# Patient Record
Sex: Male | Born: 1935 | Race: White | Hispanic: No | Marital: Married | Smoking: Never smoker
Health system: Southern US, Community
[De-identification: ages and names within clinical notes are randomized; demographics above are authoritative.]

## PROBLEM LIST (undated history)

## (undated) DIAGNOSIS — I10 Essential (primary) hypertension: Secondary | ICD-10-CM

## (undated) DIAGNOSIS — F4320 Adjustment disorder, unspecified: Secondary | ICD-10-CM

## (undated) DIAGNOSIS — E669 Obesity, unspecified: Secondary | ICD-10-CM

## (undated) DIAGNOSIS — F419 Anxiety disorder, unspecified: Secondary | ICD-10-CM

## (undated) DIAGNOSIS — E785 Hyperlipidemia, unspecified: Secondary | ICD-10-CM

## (undated) DIAGNOSIS — I4460 Unspecified fascicular block: Secondary | ICD-10-CM

## (undated) DIAGNOSIS — F439 Reaction to severe stress, unspecified: Secondary | ICD-10-CM

## (undated) DIAGNOSIS — I509 Heart failure, unspecified: Secondary | ICD-10-CM

## (undated) DIAGNOSIS — E119 Type 2 diabetes mellitus without complications: Secondary | ICD-10-CM

## (undated) DIAGNOSIS — I209 Angina pectoris, unspecified: Secondary | ICD-10-CM

## (undated) DIAGNOSIS — I4891 Unspecified atrial fibrillation: Secondary | ICD-10-CM

## (undated) DIAGNOSIS — I2510 Atherosclerotic heart disease of native coronary artery without angina pectoris: Secondary | ICD-10-CM

## (undated) DIAGNOSIS — R0602 Shortness of breath: Secondary | ICD-10-CM

## (undated) DIAGNOSIS — L408 Other psoriasis: Secondary | ICD-10-CM

## (undated) HISTORY — DX: Reaction to severe stress, unspecified: F43.9

## (undated) HISTORY — DX: Hyperlipidemia, unspecified: E78.5

## (undated) HISTORY — DX: Angina pectoris, unspecified: I20.9

## (undated) HISTORY — DX: Other psoriasis: L40.8

## (undated) HISTORY — DX: Atherosclerotic heart disease of native coronary artery without angina pectoris: I25.10

## (undated) HISTORY — DX: Unspecified atrial fibrillation: I48.91

## (undated) HISTORY — DX: Heart failure, unspecified: I50.9

## (undated) HISTORY — DX: Type 2 diabetes mellitus without complications: E11.9

## (undated) HISTORY — DX: Essential (primary) hypertension: I10

## (undated) HISTORY — DX: Unspecified fascicular block: I44.60

## (undated) HISTORY — PX: OTHER SURGICAL HISTORY: SHX169

## (undated) HISTORY — DX: Shortness of breath: R06.02

## (undated) HISTORY — DX: Adjustment disorder, unspecified: F43.20

## (undated) HISTORY — DX: Anxiety disorder, unspecified: F41.9

## (undated) HISTORY — DX: Obesity, unspecified: E66.9

---

## 1988-02-09 HISTORY — PX: OTHER SURGICAL HISTORY: SHX169

## 2002-01-09 ENCOUNTER — Inpatient Hospital Stay (HOSPITAL_COMMUNITY): Admission: AD | Admit: 2002-01-09 | Discharge: 2002-01-17 | Payer: Self-pay | Admitting: Cardiology

## 2002-01-09 ENCOUNTER — Encounter: Payer: Self-pay | Admitting: Cardiology

## 2002-01-11 ENCOUNTER — Encounter: Payer: Self-pay | Admitting: Surgery

## 2002-01-12 ENCOUNTER — Encounter: Payer: Self-pay | Admitting: Surgery

## 2002-01-13 ENCOUNTER — Encounter: Payer: Self-pay | Admitting: Surgery

## 2002-02-05 ENCOUNTER — Encounter: Payer: Self-pay | Admitting: Cardiovascular Disease

## 2002-02-05 ENCOUNTER — Inpatient Hospital Stay (HOSPITAL_COMMUNITY): Admission: EM | Admit: 2002-02-05 | Discharge: 2002-02-12 | Payer: Self-pay | Admitting: Emergency Medicine

## 2002-02-06 ENCOUNTER — Encounter: Payer: Self-pay | Admitting: Cardiovascular Disease

## 2002-02-07 ENCOUNTER — Encounter: Payer: Self-pay | Admitting: Surgery

## 2002-02-08 ENCOUNTER — Encounter: Payer: Self-pay | Admitting: Surgery

## 2002-02-09 ENCOUNTER — Encounter: Payer: Self-pay | Admitting: Cardiothoracic Surgery

## 2002-03-12 ENCOUNTER — Encounter: Payer: Self-pay | Admitting: Surgery

## 2002-03-12 ENCOUNTER — Encounter: Admission: RE | Admit: 2002-03-12 | Discharge: 2002-03-12 | Payer: Self-pay | Admitting: Surgery

## 2002-05-30 ENCOUNTER — Encounter: Payer: Self-pay | Admitting: Cardiology

## 2002-05-30 ENCOUNTER — Encounter: Admission: RE | Admit: 2002-05-30 | Discharge: 2002-05-30 | Payer: Self-pay | Admitting: Cardiology

## 2002-11-26 ENCOUNTER — Encounter (HOSPITAL_COMMUNITY): Admission: RE | Admit: 2002-11-26 | Discharge: 2003-02-24 | Payer: Self-pay | Admitting: Cardiology

## 2010-05-13 ENCOUNTER — Emergency Department (HOSPITAL_COMMUNITY): Payer: Medicare Other

## 2010-05-13 ENCOUNTER — Emergency Department (HOSPITAL_COMMUNITY)
Admission: EM | Admit: 2010-05-13 | Discharge: 2010-05-13 | Disposition: A | Discharge status: Home / Self Care | Payer: Medicare Other | Attending: Emergency Medicine | Admitting: Emergency Medicine

## 2010-05-13 DIAGNOSIS — S9030XA Contusion of unspecified foot, initial encounter: Secondary | ICD-10-CM | POA: Insufficient documentation

## 2010-05-13 DIAGNOSIS — I10 Essential (primary) hypertension: Secondary | ICD-10-CM | POA: Insufficient documentation

## 2010-05-13 DIAGNOSIS — W208XXA Other cause of strike by thrown, projected or falling object, initial encounter: Secondary | ICD-10-CM | POA: Insufficient documentation

## 2010-05-13 DIAGNOSIS — E7800 Pure hypercholesterolemia, unspecified: Secondary | ICD-10-CM | POA: Insufficient documentation

## 2010-05-13 DIAGNOSIS — M899 Disorder of bone, unspecified: Secondary | ICD-10-CM | POA: Insufficient documentation

## 2011-10-12 IMAGING — CR DG TIBIA/FIBULA 2V*R*
4 series · 4 of 4 positions shown · non-contrast
Comparison: None

CLINICAL DATA: Diffuse pain and swelling

RIGHT TIBIA AND FIBULA - 2 VIEW

[t tib/fib ap right (1 of 2)]
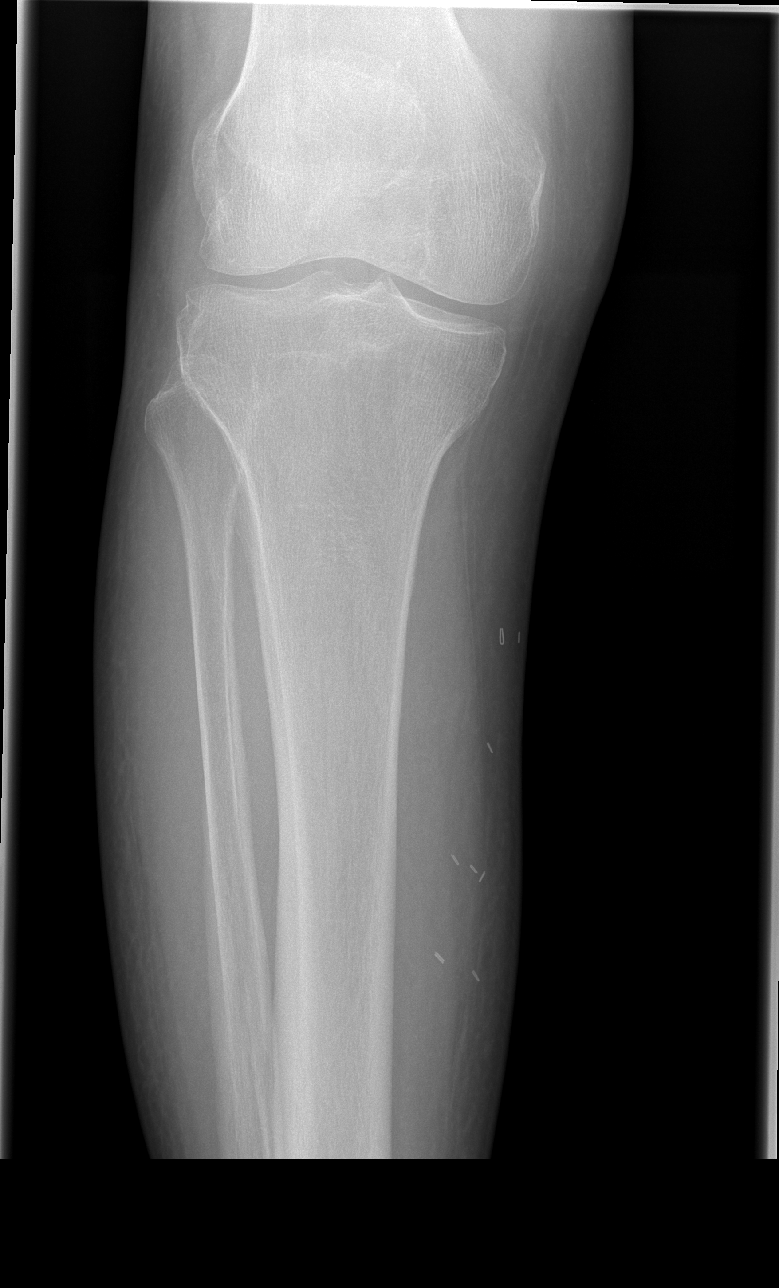

[t tib/fib ap right (2 of 2)]
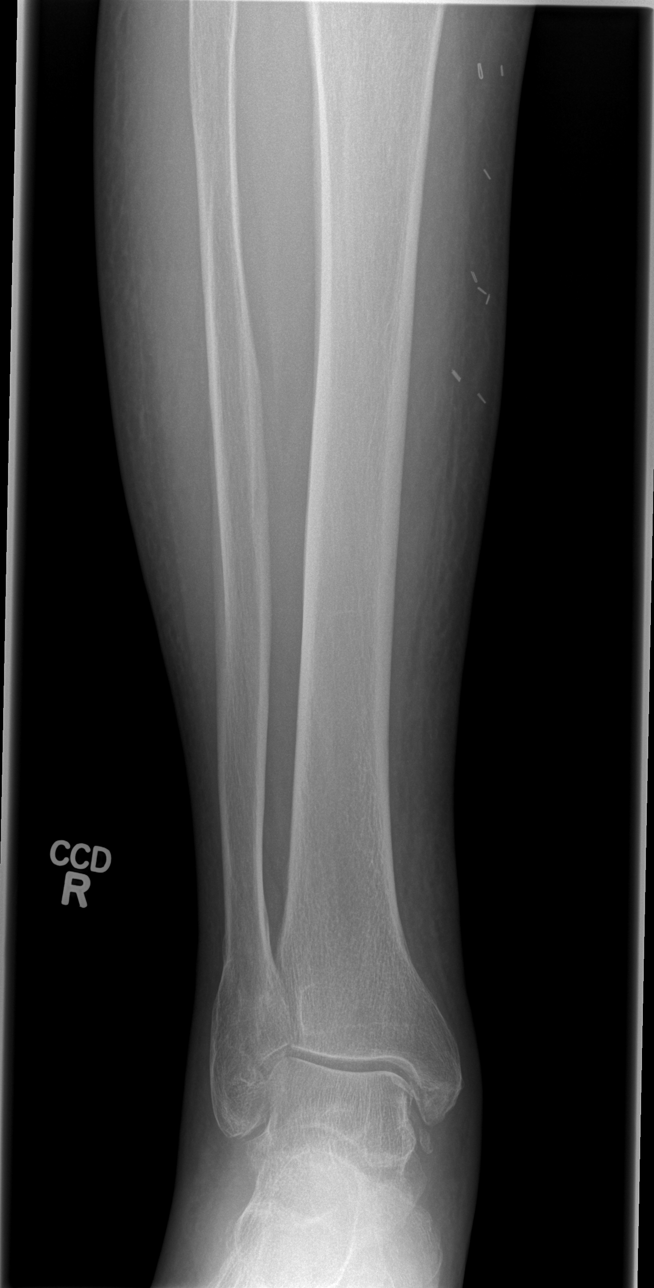

[t tib/fib lat right (1 of 2)]
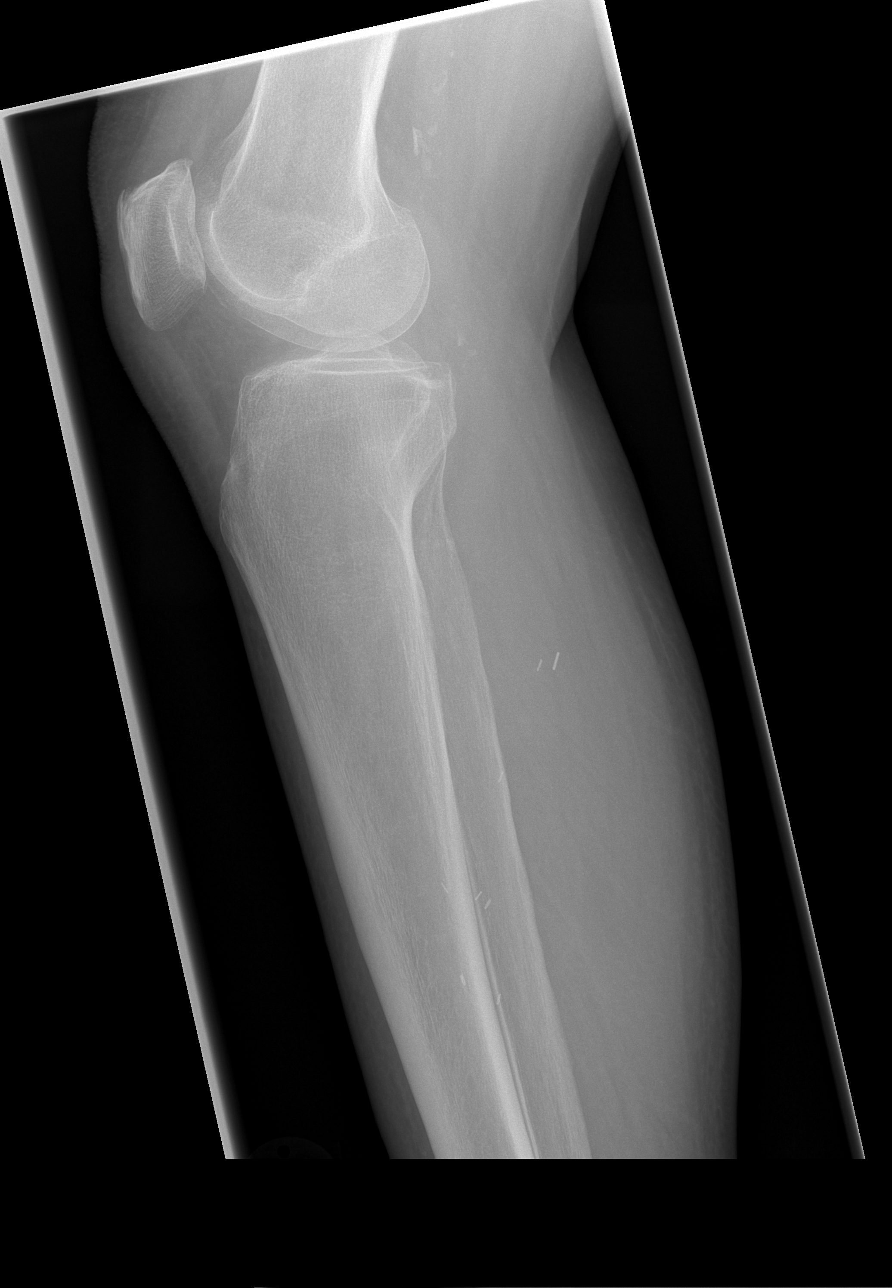

[t tib/fib lat right (2 of 2)]
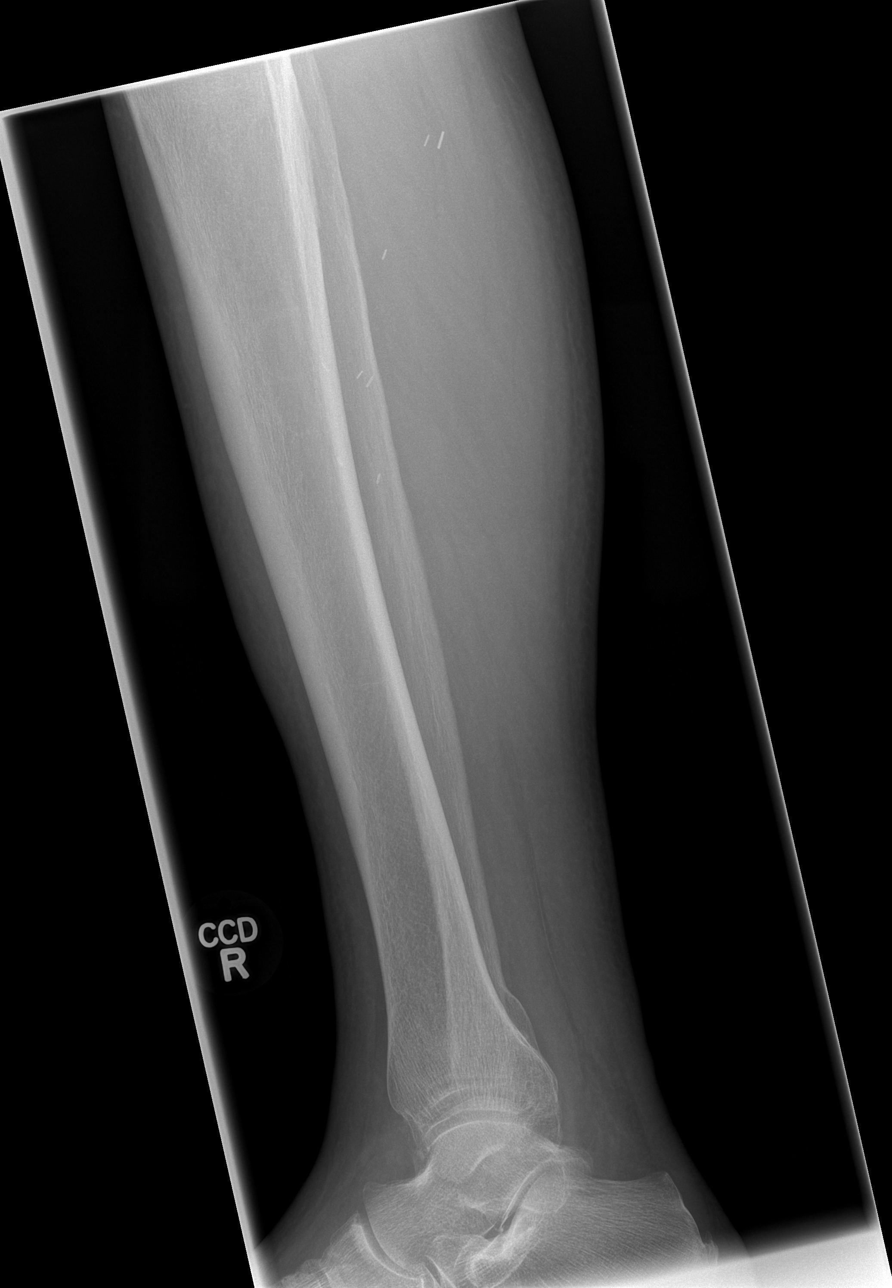

[4 of 4 positions shown; findings below may reference images not displayed]

FINDINGS: There is a chronic fracture deformity involving the
distal fibula.

No acute fractures or dislocations identified.

Mild diffuse soft tissue swelling.
IMPRESSION: 1.  Soft tissue swelling
2.  Chronic fracture deformity of the distal fibula.

## 2012-03-03 ENCOUNTER — Encounter: Payer: Self-pay | Admitting: *Deleted

## 2012-03-03 DIAGNOSIS — I10 Essential (primary) hypertension: Secondary | ICD-10-CM | POA: Insufficient documentation

## 2012-03-03 DIAGNOSIS — E669 Obesity, unspecified: Secondary | ICD-10-CM | POA: Insufficient documentation

## 2012-03-03 DIAGNOSIS — I4460 Unspecified fascicular block: Secondary | ICD-10-CM | POA: Insufficient documentation

## 2012-03-03 DIAGNOSIS — R609 Edema, unspecified: Secondary | ICD-10-CM | POA: Insufficient documentation

## 2012-03-03 DIAGNOSIS — E559 Vitamin D deficiency, unspecified: Secondary | ICD-10-CM | POA: Insufficient documentation

## 2012-03-03 DIAGNOSIS — L408 Other psoriasis: Secondary | ICD-10-CM | POA: Insufficient documentation

## 2012-03-03 DIAGNOSIS — I509 Heart failure, unspecified: Secondary | ICD-10-CM | POA: Insufficient documentation

## 2012-03-03 DIAGNOSIS — I2510 Atherosclerotic heart disease of native coronary artery without angina pectoris: Secondary | ICD-10-CM

## 2012-03-03 DIAGNOSIS — E119 Type 2 diabetes mellitus without complications: Secondary | ICD-10-CM

## 2012-03-03 DIAGNOSIS — R0602 Shortness of breath: Secondary | ICD-10-CM | POA: Insufficient documentation

## 2012-03-03 DIAGNOSIS — I209 Angina pectoris, unspecified: Secondary | ICD-10-CM | POA: Insufficient documentation

## 2012-03-03 DIAGNOSIS — J301 Allergic rhinitis due to pollen: Secondary | ICD-10-CM | POA: Insufficient documentation

## 2012-03-03 DIAGNOSIS — I4891 Unspecified atrial fibrillation: Secondary | ICD-10-CM

## 2012-03-03 DIAGNOSIS — E785 Hyperlipidemia, unspecified: Secondary | ICD-10-CM

## 2012-03-03 DIAGNOSIS — F411 Generalized anxiety disorder: Secondary | ICD-10-CM | POA: Insufficient documentation

## 2012-03-03 DIAGNOSIS — F430 Acute stress reaction: Secondary | ICD-10-CM | POA: Insufficient documentation

## 2012-04-25 ENCOUNTER — Other Ambulatory Visit: Payer: Self-pay | Admitting: *Deleted

## 2012-04-25 DIAGNOSIS — E785 Hyperlipidemia, unspecified: Secondary | ICD-10-CM

## 2012-04-25 DIAGNOSIS — E119 Type 2 diabetes mellitus without complications: Secondary | ICD-10-CM

## 2012-04-27 ENCOUNTER — Other Ambulatory Visit: Payer: Medicare Other

## 2012-04-27 DIAGNOSIS — I209 Angina pectoris, unspecified: Secondary | ICD-10-CM

## 2012-04-28 LAB — COMPREHENSIVE METABOLIC PANEL
Albumin: 4.6 g/dL (ref 3.5–4.8)
Alkaline Phosphatase: 88 IU/L (ref 39–117)
BUN/Creatinine Ratio: 12 (ref 10–22)
BUN: 12 mg/dL (ref 8–27)
Chloride: 100 mmol/L (ref 97–108)
GFR calc Af Amer: 79 mL/min/{1.73_m2} (ref >59)
Globulin, Total: 2.4 g/dL (ref 1.5–4.5)
Total Bilirubin: 0.8 mg/dL (ref 0.0–1.2)

## 2012-04-28 LAB — PLEASE NOTE

## 2012-04-28 LAB — MICROALBUMIN / CREATININE URINE RATIO: Creatinine, Ur: 48.3 mg/dL (ref 22.0–328.0)

## 2012-04-28 LAB — HEMOGLOBIN A1C: Est. average glucose Bld gHb Est-mCnc: 111 mg/dL

## 2012-05-02 ENCOUNTER — Ambulatory Visit (INDEPENDENT_AMBULATORY_CARE_PROVIDER_SITE_OTHER): Payer: Medicare Other | Admitting: Internal Medicine

## 2012-05-02 ENCOUNTER — Encounter: Payer: Self-pay | Admitting: Internal Medicine

## 2012-05-02 VITALS — BP 142/64 | HR 64 | Temp 95.2°F | Resp 20 | Ht 69.5 in | Wt 193.8 lb

## 2012-05-02 DIAGNOSIS — E119 Type 2 diabetes mellitus without complications: Secondary | ICD-10-CM

## 2012-05-02 DIAGNOSIS — I4891 Unspecified atrial fibrillation: Secondary | ICD-10-CM

## 2012-05-02 DIAGNOSIS — I509 Heart failure, unspecified: Secondary | ICD-10-CM

## 2012-05-02 DIAGNOSIS — R609 Edema, unspecified: Secondary | ICD-10-CM

## 2012-05-02 DIAGNOSIS — I10 Essential (primary) hypertension: Secondary | ICD-10-CM

## 2012-05-02 DIAGNOSIS — I2510 Atherosclerotic heart disease of native coronary artery without angina pectoris: Secondary | ICD-10-CM

## 2012-05-02 NOTE — Assessment & Plan Note (Signed)
Stable

## 2012-05-02 NOTE — Patient Instructions (Signed)
Continue current meds 

## 2012-05-02 NOTE — Progress Notes (Signed)
  Subjective:    Patient ID: Jesse Blair, male    DOB: 1930-06-04, 79 y.o.   MRN: [id number]  Hypertension Pertinent negatives include no blurred vision, chest pain, headaches, malaise/fatigue, neck pain, orthopnea, palpitations or shortness of breath.  Hyperlipidemia Pertinent negatives include no chest pain, focal weakness, myalgias or shortness of breath.      Review of Systems  Constitutional: Negative for fever, chills, weight loss, malaise/fatigue and diaphoresis.  HENT: Positive for hearing loss. Negative for ear pain, sore throat, neck pain, tinnitus and ear discharge.   Eyes: Negative for blurred vision, double vision, photophobia and pain.       Rx lenses. Cataracts per recent eye exam.  Respiratory: Negative for cough, sputum production and shortness of breath.   Cardiovascular: Negative for chest pain, palpitations, orthopnea, claudication and leg swelling.  Gastrointestinal: Negative for heartburn, nausea, vomiting, abdominal pain, diarrhea and constipation.  Endocrine: Negative for polydipsia.  Genitourinary: Negative for dysuria, urgency, frequency and flank pain.  Musculoskeletal: Negative for myalgias, back pain, joint pain and falls.  Skin: Negative for itching and rash.  Allergic/Immunologic: Negative for environmental allergies.  Neurological: Negative for dizziness, tingling, tremors, sensory change, speech change, focal weakness, seizures, weakness and headaches.  Hematological: Does not bruise/bleed easily.  Psychiatric/Behavioral: Positive for depression. Negative for suicidal ideas, memory loss and substance abuse. The patient is nervous/anxious. The patient does not have insomnia.        Stressed by recent life changes. Moved from home to retirement community.      Review of Systems  Constitutional: Negative for fever, chills, weight loss, malaise/fatigue and diaphoresis.  HENT: Positive for hearing loss. Negative for ear pain, sore throat, neck pain,  tinnitus and ear discharge.   Eyes: Negative for blurred vision, double vision, photophobia and pain.       Rx lenses. Cataracts per recent eye exam.  Respiratory: Negative for cough, sputum production and shortness of breath.   Cardiovascular: Negative for chest pain, palpitations, orthopnea, claudication and leg swelling.  Gastrointestinal: Negative for heartburn, nausea, vomiting, abdominal pain, diarrhea and constipation.  Genitourinary: Negative for dysuria, urgency, frequency and flank pain.  Musculoskeletal: Negative for myalgias, back pain, joint pain and falls.  Skin: Negative for itching and rash.  Neurological: Negative for dizziness, tingling, tremors, sensory change, speech change, focal weakness, seizures, weakness and headaches.  Endo/Heme/Allergies: Negative for environmental allergies and polydipsia. Does not bruise/bleed easily.  Psychiatric/Behavioral: Positive for depression. Negative for suicidal ideas, memory loss and substance abuse. The patient is nervous/anxious. The patient does not have insomnia.        Stressed by recent life changes. Moved from home to retirement community.    Objective:   Physical Exam  Constitutional: He appears well-developed and well-nourished. No distress.  HENT:  Head: Atraumatic.  Partial hearing loss  Eyes: Conjunctivae are normal. Pupils are equal, round, and reactive to light.  RX lenses  Cardiovascular: Normal pulses.  An irregularly irregular rhythm present. PMI is not displaced.  Exam reveals no gallop.   Murmur heard.  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  Psychiatric: His speech is normal and behavior is normal. Judgment and thought content normal. His mood appears anxious. His affect is not angry and not inappropriate. Cognition and memory are normal. He exhibits a depressed mood.          Assessment & Plan:

## 2012-05-16 ENCOUNTER — Encounter: Payer: Self-pay | Admitting: Internal Medicine

## 2012-06-29 ENCOUNTER — Other Ambulatory Visit: Payer: Self-pay | Admitting: Internal Medicine

## 2012-07-26 ENCOUNTER — Encounter: Payer: Self-pay | Admitting: *Deleted

## 2012-07-28 ENCOUNTER — Other Ambulatory Visit: Payer: Medicare Other

## 2012-07-28 ENCOUNTER — Encounter: Payer: Self-pay | Admitting: *Deleted

## 2012-07-28 DIAGNOSIS — E119 Type 2 diabetes mellitus without complications: Secondary | ICD-10-CM

## 2012-07-29 LAB — HEMOGLOBIN A1C
Est. average glucose Bld gHb Est-mCnc: 114 mg/dL
Hgb A1c MFr Bld: 5.6 % (ref 4.8–5.6)

## 2012-07-29 LAB — BASIC METABOLIC PANEL
BUN/Creatinine Ratio: 12 (ref 10–22)
BUN: 12 mg/dL (ref 8–27)
CO2: 24 mmol/L (ref 18–29)
Calcium: 9.4 mg/dL (ref 8.6–10.2)
Chloride: 104 mmol/L (ref 97–108)
Creatinine, Ser: 0.99 mg/dL (ref 0.76–1.27)
Sodium: 145 mmol/L — ABNORMAL HIGH (ref 134–144)

## 2012-07-29 LAB — LIPID PANEL
Chol/HDL Ratio: 5.1 ratio units — ABNORMAL HIGH (ref 0.0–5.0)
LDL Calculated: 99 mg/dL (ref 0–99)
VLDL Cholesterol Cal: 33 mg/dL (ref 5–40)

## 2012-08-01 ENCOUNTER — Ambulatory Visit (INDEPENDENT_AMBULATORY_CARE_PROVIDER_SITE_OTHER): Payer: Medicare Other | Admitting: Internal Medicine

## 2012-08-01 ENCOUNTER — Encounter: Payer: Self-pay | Admitting: Internal Medicine

## 2012-08-01 VITALS — BP 170/80 | HR 66 | Temp 97.4°F | Resp 16 | Ht 69.5 in | Wt 195.0 lb

## 2012-08-01 DIAGNOSIS — I10 Essential (primary) hypertension: Secondary | ICD-10-CM

## 2012-08-01 DIAGNOSIS — I4891 Unspecified atrial fibrillation: Secondary | ICD-10-CM

## 2012-08-01 DIAGNOSIS — E119 Type 2 diabetes mellitus without complications: Secondary | ICD-10-CM

## 2012-08-01 DIAGNOSIS — E785 Hyperlipidemia, unspecified: Secondary | ICD-10-CM

## 2012-08-01 DIAGNOSIS — I2510 Atherosclerotic heart disease of native coronary artery without angina pectoris: Secondary | ICD-10-CM

## 2012-08-01 DIAGNOSIS — I209 Angina pectoris, unspecified: Secondary | ICD-10-CM

## 2012-08-01 MED ORDER — NITROGLYCERIN 0.4 MG/SPRAY TL SOLN: Status: AC

## 2012-08-01 NOTE — Patient Instructions (Addendum)
Continue current medications. You may discontiue the furosemide.

## 2012-08-01 NOTE — Progress Notes (Signed)
Subjective:    Patient ID: Jesse Blair, male    DOB: [birth year], 79 y.o.   MRN: [id number]  HPI Atrial fibrillation: Rate controlled. Asymptomatic  Type II or unspecified type diabetes mellitus without mention of complication, not stated as uncontrolled : Controled  Unspecified essential hypertension: Controlled  Coronary atherosclerosis of unspecified type of vessel, native or graft: Asymptomatic. Denies angina or palpitations.  Other and unspecified hyperlipidemia: Controlled  Other and unspecified angina pectoris: No recent episodes    Current Outpatient Prescriptions on File Prior to Visit  Medication Sig Dispense Refill  . ALPRAZolam (XANAX) 0.25 MG tablet Take 1 tablet 3 times a day as needed for nerves.      Marland Kitchen aspirin 325 MG tablet Take one tablet once a day to prevent heart attack or stroke.      . benazepril (LOTENSIN) 20 MG tablet TAKE ONE TABLET BY MOUTH EVERY DAY TO CONTROL BLOOD PRESSURE  90 tablet  0  . cholecalciferol (VITAMIN D) 1000 UNITS tablet Take one tablet once a day for vitamin d supplement      . simvastatin (ZOCOR) 40 MG tablet Take one tablet once a day for cholesterol.      . TURMERIC CURCUMIN PO Take one tablet once a day       No current facility-administered medications on file prior to visit.    Review of Systems  Constitutional: Negative for fever, chills, weight loss, malaise/fatigue and diaphoresis.  HENT: Positive for hearing loss. Negative for ear pain, sore throat, neck pain, tinnitus and ear discharge.   Eyes: Negative for blurred vision, double vision, photophobia and pain.       Rx lenses. Cataracts per recent eye exam.  Respiratory: Negative for cough, sputum production and shortness of breath.   Cardiovascular: Negative for chest pain, palpitations, orthopnea, claudication and leg swelling.  Gastrointestinal: Negative for heartburn, nausea, vomiting, abdominal pain, diarrhea and constipation.  Endocrine: Negative for polydipsia.   Genitourinary: Negative for dysuria, urgency, frequency and flank pain.  Musculoskeletal: Negative for myalgias, back pain, joint pain and falls.  Skin: Negative for itching and rash.  Allergic/Immunologic: Negative for environmental allergies.  Neurological: Negative for dizziness, tingling, tremors, sensory change, speech change, focal weakness, seizures, weakness and headaches.  Hematological: Does not bruise/bleed easily.  Psychiatric/Behavioral: Positive for depression. Negative for suicidal ideas, memory loss and substance abuse. The patient is nervous/anxious. The patient does not have insomnia.        Stressed by recent life changes. Moved from home to retirement community.       Objective:   Physical Exam  Constitutional: He is oriented to person, place, and time. He appears well-developed and well-nourished. No distress.  HENT:  Head: Atraumatic.  Partial hearing loss  Eyes: Conjunctivae are normal. Pupils are equal, round, and reactive to light.  RX lenses  Neck: No JVD present. No tracheal deviation present. No thyromegaly present.  Cardiovascular: Normal rate and normal pulses.  An irregularly irregular rhythm present. PMI is not displaced.  Exam reveals no gallop.   Murmur heard.  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  Pulmonary/Chest: No respiratory distress. He has no wheezes. He has no rales.  Abdominal: Soft. Bowel sounds are normal. He exhibits no distension and no mass. There is no tenderness.  Musculoskeletal: Normal range of motion. He exhibits edema. He exhibits no tenderness.  Lymphadenopathy:    He has no cervical adenopathy.  Neurological: He is alert  and oriented to person, place, and time. No cranial nerve deficit. Coordination normal.  Skin: No rash noted. No erythema. No pallor.  Psychiatric: He has a normal mood and affect. His speech is normal and behavior is normal. Judgment and thought content  normal. His affect is not angry and not inappropriate. Cognition and memory are normal.          Assessment & Plan:  Atrial fibrillation: Stable  Type II or unspecified type diabetes mellitus without mention of complication, not stated as uncontrolled   - Plan: Basic Metabolic Panel, Hemoglobin A1c  Unspecified essential hypertension: Controlled  Coronary atherosclerosis of unspecified type of vessel, native or graft: Stable  Other and unspecified hyperlipidemia   - Plan: Lipid Panel  Other and unspecified angina pectoris: No recent episodes of angina.

## 2012-10-05 ENCOUNTER — Other Ambulatory Visit: Payer: Self-pay | Admitting: *Deleted

## 2012-10-05 MED ORDER — BENAZEPRIL HCL 20 MG PO TABS: ORAL_TABLET | ORAL | Status: AC

## 2012-10-30 ENCOUNTER — Other Ambulatory Visit: Payer: Medicare Other

## 2012-10-30 DIAGNOSIS — E785 Hyperlipidemia, unspecified: Secondary | ICD-10-CM

## 2012-10-30 DIAGNOSIS — E119 Type 2 diabetes mellitus without complications: Secondary | ICD-10-CM

## 2012-10-31 LAB — HEMOGLOBIN A1C
Est. average glucose Bld gHb Est-mCnc: 111 mg/dL
Hgb A1c MFr Bld: 5.5 % (ref 4.8–5.6)

## 2012-10-31 LAB — BASIC METABOLIC PANEL
BUN/Creatinine Ratio: 14 (ref 10–22)
CO2: 25 mmol/L (ref 18–29)
Calcium: 9.5 mg/dL (ref 8.6–10.2)
Chloride: 102 mmol/L (ref 97–108)
Sodium: 143 mmol/L (ref 134–144)

## 2012-10-31 LAB — LIPID PANEL: Chol/HDL Ratio: 5.1 ratio units — ABNORMAL HIGH (ref 0.0–5.0)

## 2012-11-01 ENCOUNTER — Ambulatory Visit (INDEPENDENT_AMBULATORY_CARE_PROVIDER_SITE_OTHER): Payer: Medicare Other | Admitting: Internal Medicine

## 2012-11-01 ENCOUNTER — Encounter: Payer: Self-pay | Admitting: Internal Medicine

## 2012-11-01 VITALS — BP 140/90 | HR 51 | Temp 97.4°F | Resp 18 | Ht 69.5 in | Wt 195.6 lb

## 2012-11-01 DIAGNOSIS — R609 Edema, unspecified: Secondary | ICD-10-CM

## 2012-11-01 DIAGNOSIS — I4891 Unspecified atrial fibrillation: Secondary | ICD-10-CM

## 2012-11-01 DIAGNOSIS — L570 Actinic keratosis: Secondary | ICD-10-CM | POA: Insufficient documentation

## 2012-11-01 DIAGNOSIS — Z23 Encounter for immunization: Secondary | ICD-10-CM

## 2012-11-01 DIAGNOSIS — E119 Type 2 diabetes mellitus without complications: Secondary | ICD-10-CM

## 2012-11-01 DIAGNOSIS — E785 Hyperlipidemia, unspecified: Secondary | ICD-10-CM

## 2012-11-01 MED ORDER — DILTIAZEM HCL 120 MG PO TABS: ORAL_TABLET | ORAL | Status: DC

## 2012-11-01 MED ORDER — DILTIAZEM HCL 120 MG PO TABS: ORAL_TABLET | ORAL | Status: AC

## 2012-11-01 NOTE — Patient Instructions (Signed)
Use diltiazem 120 mg daily.

## 2012-11-01 NOTE — Progress Notes (Signed)
Subjective:    Patient ID: Jesse Blair, male    DOB: 1930/04/13, 79 y.o.   MRN: [id number]  HPI Type II or unspecified type diabetes mellitus without mention of complication, not stated as uncontrolled : controlled  Actinic keratosis of right cheek: stable  Multiple actinic keratoses: stable  Atrial fibrillation: chronic. Asymptomatic  Edema:  imroved  Need for prophylactic vaccination and inoculation against influenza  Other and unspecified hyperlipidemia: controlled    Current Outpatient Prescriptions on File Prior to Visit  Medication Sig Dispense Refill  . ALPRAZolam (XANAX) 0.25 MG tablet Take 1 tablet 3 times a day as needed for nerves.      Marland Kitchen aspirin 325 MG tablet Take one tablet once a day to prevent heart attack or stroke.      . benazepril (LOTENSIN) 20 MG tablet Take one tablet by mouth once daily to control blood pressure  90 tablet  3  . cholecalciferol (VITAMIN D) 1000 UNITS tablet Take one tablet once a day for vitamin d supplement      . diltiazem (CARDIZEM) 120 MG tablet Take 120 mg by mouth daily.       . nitroGLYCERIN (NITROLINGUAL) 0.4 MG/SPRAY spray At the onset of an attack, one or two sprays should be administered onto or under the tongue. No more than 3 sprays within 15 minute period.*dispense the 60 dose spray*  4.9 g  5  . simvastatin (ZOCOR) 40 MG tablet Take one tablet once a day for cholesterol.      . TURMERIC CURCUMIN PO Take one tablet once a day       No current facility-administered medications on file prior to visit.   History   Social History  . Marital Status: Married    Spouse Name: N/A    Number of Children: N/A  . Years of Education: N/A   Social History Main Topics  . Smoking status: Never Smoker   . Smokeless tobacco: None  . Alcohol Use: None  . Drug Use: None  . Sexual Activity: Yes   Other Topics Concern  . None   Social History Narrative   Under a lot of stress due to the move of his wife from their home to a  retirement home. He is now physically separated from her and living at [facility] in [place], [place]. She is in the extended nursing area at [facility] in [place].           Review of Systems  Constitutional: Negative for fever, chills, weight loss, malaise/fatigue and diaphoresis.  HENT: Positive for hearing loss. Negative for ear discharge, ear pain, sore throat and tinnitus.   Eyes: Negative for blurred vision, double vision, photophobia and pain.       Rx lenses. Cataracts per recent eye exam.  Respiratory: Negative for cough, sputum production and shortness of breath.   Cardiovascular: Negative for chest pain, palpitations, orthopnea, claudication and leg swelling.  Gastrointestinal: Negative for heartburn, nausea, vomiting, abdominal pain, diarrhea and constipation.  Endocrine: Negative for polydipsia.  Genitourinary: Negative for dysuria, urgency, frequency and flank pain.  Musculoskeletal: Negative for back pain, falls, joint pain, myalgias and neck pain.  Skin: Negative for itching and rash.  Allergic/Immunologic: Negative for environmental allergies.  Neurological: Negative for dizziness, tingling, tremors, sensory change, speech change, focal weakness, seizures, weakness and headaches.  Hematological: Does not bruise/bleed easily.  Psychiatric/Behavioral: Positive for depression. Negative for suicidal ideas, memory loss and substance abuse. The patient is nervous/anxious. The patient does  not have insomnia.        Stressed by recent life changes. Moved from home to retirement community.       Objective:BP 140/90  Pulse 51  Temp(Src) 97.4 F (36.3 C) (Oral)  Resp 18  Ht 5' 9.5" (1.765 m)  Wt 195 lb 9.6 oz (88.724 kg)  BMI 28.48 kg/m2  SpO2 99%    Physical Exam  Constitutional: He is oriented to person, place, and time. He appears well-developed and well-nourished. No distress.  HENT:  Head: Atraumatic.  Partial hearing loss  Eyes: Conjunctivae are normal.  Pupils are equal, round, and reactive to light.  RX lenses  Neck: No JVD present. No tracheal deviation present. No thyromegaly present.  Cardiovascular: Normal rate and normal pulses.  An irregularly irregular rhythm present. PMI is not displaced.  Exam reveals no gallop.   Murmur heard.  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  Pulmonary/Chest: No respiratory distress. He has no wheezes. He has no rales.  Abdominal: Soft. Bowel sounds are normal. He exhibits no distension and no mass. There is no tenderness.  Musculoskeletal: Normal range of motion. He exhibits edema. He exhibits no tenderness.  Lymphadenopathy:    He has no cervical adenopathy.  Neurological: He is alert and oriented to person, place, and time. No cranial nerve deficit. Coordination normal.  Skin: No rash noted. No erythema. No pallor.  Psychiatric: He has a normal mood and affect. His speech is normal and behavior is normal. Judgment and thought content normal. His affect is not angry and not inappropriate. Cognition and memory are normal.      Appointment on 10/30/2012  Component Date Value Range Status  . Glucose 10/30/2012 85  65 - 99 mg/dL Final  . BUN 40/98/1191 13  8 - 27 mg/dL Final  . Creatinine, Ser 10/30/2012 0.96  0.76 - 1.27 mg/dL Final  . GFR calc non Af Amer 10/30/2012 74  >59 mL/min/1.73 Final  . GFR calc Af Amer 10/30/2012 86  >59 mL/min/1.73 Final  . BUN/Creatinine Ratio 10/30/2012 14  10 - 22 Final  . Sodium 10/30/2012 143  134 - 144 mmol/L Final  . Potassium 10/30/2012 3.6  3.5 - 5.2 mmol/L Final  . Chloride 10/30/2012 102  97 - 108 mmol/L Final  . CO2 10/30/2012 25  18 - 29 mmol/L Final  . Calcium 10/30/2012 9.5  8.6 - 10.2 mg/dL Final  . Cholesterol, Total 10/30/2012 154  100 - 199 mg/dL Final  . Triglycerides 10/30/2012 158* 0 - 149 mg/dL Final  . HDL 47/82/9562 30* >39 mg/dL Final   Comment: According to ATP-III Guidelines, HDL-C >59  mg/dL is considered a                          negative risk factor for CHD.  Marland Kitchen VLDL Cholesterol Cal 10/30/2012 32  5 - 40 mg/dL Final  . LDL Calculated 10/30/2012 92  0 - 99 mg/dL Final  . Chol/HDL Ratio 10/30/2012 5.1* 0.0 - 5.0 ratio units Final  . Hemoglobin A1C 10/30/2012 5.5  4.8 - 5.6 % Final   Comment:          Increased risk for diabetes: 5.7 - 6.4                                   Diabetes: >6.4  Glycemic control for adults with diabetes: <7.0  . Estimated average glucose 10/30/2012 111   Final       Assessment & Plan:  1. Actinic keratosis of right cheek See Derm when he desires  2. Multiple actinic keratoses See Derm when he desires  3. Atrial fibrillation Chronic and stable. Rate controlled  4. Edema improved  5. Need for prophylactic vaccination and inoculation against influenza administered  6. Other and unspecified hyperlipidemia stable - Lipid panel; Future  7. Type II or unspecified type diabetes mellitus without mention of complication, not stated as uncontrolled Controlled - Hemoglobin A1c; Future - Comprehensive metabolic panel; Future

## 2012-11-29 ENCOUNTER — Ambulatory Visit (INDEPENDENT_AMBULATORY_CARE_PROVIDER_SITE_OTHER): Payer: Medicare Other | Admitting: Internal Medicine

## 2012-11-29 ENCOUNTER — Encounter: Payer: Self-pay | Admitting: Internal Medicine

## 2012-11-29 VITALS — BP 142/90 | HR 66 | Temp 98.1°F | Resp 16 | Wt 191.6 lb

## 2012-11-29 DIAGNOSIS — I2510 Atherosclerotic heart disease of native coronary artery without angina pectoris: Secondary | ICD-10-CM

## 2012-11-29 DIAGNOSIS — I10 Essential (primary) hypertension: Secondary | ICD-10-CM

## 2012-11-29 DIAGNOSIS — E119 Type 2 diabetes mellitus without complications: Secondary | ICD-10-CM

## 2012-11-29 DIAGNOSIS — E785 Hyperlipidemia, unspecified: Secondary | ICD-10-CM

## 2012-11-29 NOTE — Patient Instructions (Signed)
Continue current medications. 

## 2012-11-29 NOTE — Progress Notes (Signed)
Subjective:    Patient ID: Jesse Blair, male    DOB: [birth year], 79 y.o.   MRN: [id number]  Chief Complaint  Patient presents with  . Follow-up    fill out paper for [facility] for an apartment.    HPI Planning to go to [facility]. Needs forms completed. Feeling well  Current Outpatient Prescriptions on File Prior to Visit  Medication Sig Dispense Refill  . ALPRAZolam (XANAX) 0.25 MG tablet Take 1 tablet 3 times a day as needed for nerves.      Marland Kitchen aspirin 325 MG tablet Take one tablet once a day to prevent heart attack or stroke.      . benazepril (LOTENSIN) 20 MG tablet Take one tablet by mouth once daily to control blood pressure  90 tablet  3  . cholecalciferol (VITAMIN D) 1000 UNITS tablet Take one tablet once a day for vitamin d supplement      . diltiazem (CARDIZEM) 120 MG tablet One daily to control BP and heart rhythm  90 tablet  3  . nitroGLYCERIN (NITROLINGUAL) 0.4 MG/SPRAY spray At the onset of an attack, one or two sprays should be administered onto or under the tongue. No more than 3 sprays within 15 minute period.*dispense the 60 dose spray*  4.9 g  5  . simvastatin (ZOCOR) 40 MG tablet Take one tablet once a day for cholesterol.      . TURMERIC CURCUMIN PO Take one tablet once a day       No current facility-administered medications on file prior to visit.    Review of Systems  Constitutional: Negative for fever, chills, diaphoresis, activity change, appetite change and fatigue.  HENT: Negative for congestion.   Eyes: Positive for visual disturbance (corrective lenses).  Respiratory: Negative.   Cardiovascular: Negative for chest pain, palpitations and leg swelling.  Gastrointestinal: Negative for nausea, abdominal pain, diarrhea and abdominal distention.  Endocrine: Negative for cold intolerance, heat intolerance, polydipsia, polyphagia and polyuria.  Genitourinary: Negative.   Musculoskeletal: Negative.   Allergic/Immunologic: Negative.   Neurological:       Mild  memory loss  Hematological: Negative.   Psychiatric/Behavioral: Negative.        Objective:BP 142/90  Pulse 66  Temp(Src) 98.1 F (36.7 C) (Oral)  Resp 16  Wt 191 lb 9.6 oz (86.909 kg)  BMI 27.9 kg/m2  SpO2 99%    Physical Exam  Constitutional: He is oriented to person, place, and time. He appears well-developed and well-nourished. No distress.  HENT:  Head: Normocephalic and atraumatic.  Right Ear: External ear normal.  Left Ear: External ear normal.  Nose: Nose normal.  Mouth/Throat: Oropharynx is clear and moist.  Eyes: Conjunctivae and EOM are normal. Pupils are equal, round, and reactive to light.  Neck: No JVD present. No tracheal deviation present. No thyromegaly present.  Cardiovascular: Normal rate, regular rhythm, normal heart sounds and intact distal pulses.  Exam reveals no gallop and no friction rub.   No murmur heard. Pulmonary/Chest: No respiratory distress. He has no wheezes. He has no rales.  Abdominal: He exhibits no distension and no mass. There is no tenderness.  Musculoskeletal: Normal range of motion. He exhibits no edema and no tenderness.  Lymphadenopathy:    He has no cervical adenopathy.  Neurological: He is alert and oriented to person, place, and time. He has normal reflexes. No cranial nerve deficit. Coordination normal.  Skin: No rash noted. No erythema. No pallor.  Psychiatric: He has a normal mood and affect.  His behavior is normal. Judgment and thought content normal.      Appointment on 10/30/2012  Component Date Value Range Status  . Glucose 10/30/2012 85  65 - 99 mg/dL Final  . BUN 16/11/9602 13  8 - 27 mg/dL Final  . Creatinine, Ser 10/30/2012 0.96  0.76 - 1.27 mg/dL Final  . GFR calc non Af Amer 10/30/2012 74  >59 mL/min/1.73 Final  . GFR calc Af Amer 10/30/2012 86  >59 mL/min/1.73 Final  . BUN/Creatinine Ratio 10/30/2012 14  10 - 22 Final  . Sodium 10/30/2012 143  134 - 144 mmol/L Final  . Potassium 10/30/2012 3.6  3.5 - 5.2  mmol/L Final  . Chloride 10/30/2012 102  97 - 108 mmol/L Final  . CO2 10/30/2012 25  18 - 29 mmol/L Final  . Calcium 10/30/2012 9.5  8.6 - 10.2 mg/dL Final  . Cholesterol, Total 10/30/2012 154  100 - 199 mg/dL Final  . Triglycerides 10/30/2012 158* 0 - 149 mg/dL Final  . HDL 54/10/8117 30* >39 mg/dL Final   Comment: According to ATP-III Guidelines, HDL-C >59 mg/dL is considered a                          negative risk factor for CHD.  Marland Kitchen VLDL Cholesterol Cal 10/30/2012 32  5 - 40 mg/dL Final  . LDL Calculated 10/30/2012 92  0 - 99 mg/dL Final  . Chol/HDL Ratio 10/30/2012 5.1* 0.0 - 5.0 ratio units Final  . Hemoglobin A1C 10/30/2012 5.5  4.8 - 5.6 % Final   Comment:          Increased risk for diabetes: 5.7 - 6.4                                   Diabetes: >6.4                                   Glycemic control for adults with diabetes: <7.0  . Estimated average glucose 10/30/2012 111   Final       Assessment & Plan:   Hyperlipidemia - Plan: Lipid panel  BPH (benign prostatic hyperplasia): stable  Memory loss: mild. Remains functional  Diabetes mellitus without complication: stable  Hypertension: controlled - Plan: Comprehensive metabolic panel  Hypothyroid : controlled- Plan: levothyroxine (SYNTHROID, LEVOTHROID) 125 MCG tablet, TSH  FORMS COMPLETED FOR [facility]

## 2012-12-12 ENCOUNTER — Other Ambulatory Visit: Payer: Self-pay | Admitting: Internal Medicine

## 2012-12-27 ENCOUNTER — Encounter: Payer: Self-pay | Admitting: Internal Medicine

## 2013-01-25 ENCOUNTER — Ambulatory Visit (INDEPENDENT_AMBULATORY_CARE_PROVIDER_SITE_OTHER): Payer: Medicare Other | Admitting: Internal Medicine

## 2013-01-25 ENCOUNTER — Encounter: Payer: Self-pay | Admitting: Internal Medicine

## 2013-01-25 VITALS — BP 148/70 | HR 62 | Temp 97.4°F | Resp 18 | Ht 69.0 in | Wt 197.4 lb

## 2013-01-25 DIAGNOSIS — L03115 Cellulitis of right lower limb: Secondary | ICD-10-CM

## 2013-01-25 DIAGNOSIS — B354 Tinea corporis: Secondary | ICD-10-CM

## 2013-01-25 DIAGNOSIS — L02419 Cutaneous abscess of limb, unspecified: Secondary | ICD-10-CM

## 2013-01-25 DIAGNOSIS — L03119 Cellulitis of unspecified part of limb: Secondary | ICD-10-CM

## 2013-01-25 DIAGNOSIS — B369 Superficial mycosis, unspecified: Secondary | ICD-10-CM

## 2013-01-25 MED ORDER — NYSTATIN-TRIAMCINOLONE 100000-0.1 UNIT/GM-% EX CREA: 1.0000 "application " | TOPICAL_CREAM | Freq: Three times a day (TID) | CUTANEOUS | Status: AC

## 2013-01-25 MED ORDER — CEPHALEXIN 500 MG PO CAPS: 500.0000 mg | ORAL_CAPSULE | Freq: Four times a day (QID) | ORAL | Status: AC

## 2013-01-25 NOTE — Progress Notes (Signed)
Patient ID: Jesse Blair, male   DOB: 08-Jan-1931, 79 y.o.   MRN: [id number]   Location:  [facility]  No Known Allergies  Chief Complaint  Patient presents with  . Acute Visit    rt leg swollen, red. lt leg red with some itching    HPI: Patient is a 79 y.o. male seen in the office today for redness and swelling of right leg, redness and itching of left leg.  Next appt 1/7 with Dr. Chilton Si.  Was putting benadryl cream on b/c they are itchy.    Review of Systems:  Review of Systems  Constitutional: Negative for fever and chills.  Respiratory: Negative for shortness of breath.   Cardiovascular: Positive for leg swelling. Negative for chest pain.  Psychiatric/Behavioral: The patient is nervous/anxious.      Past Medical History  Diagnosis Date  . Hypertension   . Diabetes mellitus without complication   . Hyperlipidemia   . Anxiety   . Other and unspecified angina pectoris   . Stress at home   . Unspecified adjustment reaction     Adjustment reactions  . Atrial fibrillation   . Left bundle branch hemiblock   . Congestive heart failure, unspecified   . Shortness of breath   . Obesity, unspecified   . Coronary atherosclerosis of unspecified type of vessel, native or graft   . Other psoriasis     Past Surgical History  Procedure Laterality Date  . 2003 cabg times 6    . 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 Eeg  1990  . Ct brain  02/09/1988    atropy left temporal  lobe   . 2d echo  02/09/1988    HR    Social History:   reports that he has never smoked. He does not have any smokeless tobacco history on file. His alcohol and drug histories are not on file.  Family History  Problem Relation Age of Onset  . Heart disease Father   . Stroke Father   . Heart disease Brother   . Cancer Brother     Medications: Patient's Medications  New Prescriptions   No medications on file  Previous Medications   ALPRAZOLAM  (XANAX) 0.25 MG TABLET    Take 1 tablet 3 times a day as needed for nerves.   ASPIRIN 325 MG TABLET    Take one tablet once a day to prevent heart attack or stroke.   BENAZEPRIL (LOTENSIN) 20 MG TABLET    Take one tablet by mouth once daily to control blood pressure   CHOLECALCIFEROL (VITAMIN D) 1000 UNITS TABLET    Take one tablet once a day for vitamin d supplement   DILTIAZEM (CARDIZEM) 120 MG TABLET    One daily to control BP and heart rhythm   NITROGLYCERIN (NITROLINGUAL) 0.4 MG/SPRAY SPRAY    At the onset of an attack, one or two sprays should be administered onto or under the tongue. No more than 3 sprays within 15 minute period.*dispense the 60 dose spray*   SIMVASTATIN (ZOCOR) 40 MG TABLET    TAKE ONE TABLET BY MOUTH IN THE EVENING FOR HIGH CHOLESTEROL   TURMERIC CURCUMIN PO    Take one tablet once a day  Modified Medications   No medications on file  Discontinued Medications   DUREZOL 0.05 % EMUL       ILEVRO 0.3 % SUSP         Physical Exam:  Filed Vitals:   01/25/13 0758  BP: 148/70  Pulse: 62  Temp: 97.4 F (36.3 C)  TempSrc: Oral  Resp: 18  Height: 5\' 9"  (1.753 m)  Weight: 197 lb 6.4 oz (89.54 kg)  SpO2: 98%  Physical Exam  Constitutional: He appears well-developed and well-nourished.  Cardiovascular: Normal rate, regular rhythm and intact distal pulses.   Pulmonary/Chest: Effort normal and breath sounds normal. No respiratory distress.  Musculoskeletal: Normal range of motion.  Neurological: He is alert.  Skin: Skin is warm and dry.  Right leg with severe erythema on midshin region, evidence of excoriation present, dry skin of both lower legs and feet, left with minimal erythema but severe excoriations present, nonpitting edema of bilateral lower legs, right greater than left    Labs reviewed: Basic Metabolic Panel:  Recent Labs  95/62/13 07/28/12 0821 10/30/12 0815  NA 141 145* 143  K 3.8 3.6 3.6  CL 100 104 102  CO2 26 24 25   GLUCOSE 89 87 85  BUN 12  12 13   CREATININE 1.04 0.99 0.96  CALCIUM 9.4 9.4 9.5   Liver Function Tests:  Recent Labs  04/27/12  AST 25  ALT 13  ALKPHOS 88  BILITOT 0.8  PROT 7.0  Lipid Panel:  Recent Labs  07/28/12 0821 10/30/12 0815  HDL 32* 30*  LDLCALC 99 92  TRIG 163* 158*  CHOLHDL 5.1* 5.1*   Lab Results  Component Value Date   HGBA1C 5.5 10/30/2012   Assessment/Plan 1. Cellulitis of leg, right - cephALEXin (KEFLEX) 500 MG capsule; Take 1 capsule (500 mg total) by mouth 4 (four) times daily.  Dispense: 28 capsule; Refill: 0  2. Fungal skin infection -try cream to help with this, if not improving would use athlete's foot cream  - nystatin-triamcinolone (MYCOLOG II) cream; Apply 1 application topically 3 (three) times daily. To bilateral legs for skin infection until resolved  Dispense: 60 g; Refill: 0  Next appt: keep appt with Dr. Chilton Si

## 2013-02-14 ENCOUNTER — Ambulatory Visit: Payer: Self-pay | Admitting: Internal Medicine

## 2013-03-15 ENCOUNTER — Encounter: Payer: Self-pay | Admitting: Internal Medicine

## 2013-03-15 ENCOUNTER — Non-Acute Institutional Stay: Payer: Medicare Other | Admitting: Internal Medicine

## 2013-03-15 VITALS — BP 156/76 | HR 76 | Wt 195.0 lb

## 2013-03-15 DIAGNOSIS — I4891 Unspecified atrial fibrillation: Secondary | ICD-10-CM

## 2013-03-15 DIAGNOSIS — R609 Edema, unspecified: Secondary | ICD-10-CM

## 2013-03-15 DIAGNOSIS — E119 Type 2 diabetes mellitus without complications: Secondary | ICD-10-CM

## 2013-03-15 DIAGNOSIS — L570 Actinic keratosis: Secondary | ICD-10-CM

## 2013-03-15 DIAGNOSIS — E785 Hyperlipidemia, unspecified: Secondary | ICD-10-CM

## 2013-03-15 DIAGNOSIS — I10 Essential (primary) hypertension: Secondary | ICD-10-CM

## 2013-03-15 NOTE — Progress Notes (Signed)
Patient ID: Jesse Blair, male   DOB: 24-Feb-1930, 80 y.o.   MRN: [id number]    Location:  [facility]   Place of Service: Clinic (12)    No Known Allergies  Chief Complaint  Patient presents with  . Medical Managment of Chronic Issues    blood pressure, blood sugar, AF    HPI:   Has completed the move to [facility].  Type II or unspecified type diabetes mellitus without mention of complication, not stated as uncontrolled: controlled  Unspecified essential hypertension: mild elevation in SBP  Other and unspecified hyperlipidemia: Controlled  Actinic keratosis of right cheek: unchanged; scalpand other places  Atrial fibrillation: seems to be in NSR at this time  Edema: unchanged    Medications: Patient's Medications  New Prescriptions   No medications on file  Previous Medications   ALPRAZOLAM (XANAX) 0.25 MG TABLET    Take 1 tablet 3 times a day as needed for nerves.   ASPIRIN 325 MG TABLET    Take one tablet once a day to prevent heart attack or stroke.   BENAZEPRIL (LOTENSIN) 20 MG TABLET    Take one tablet by mouth once daily to control blood pressure   CEPHALEXIN (KEFLEX) 500 MG CAPSULE    Take 1 capsule (500 mg total) by mouth 4 (four) times daily.   CHOLECALCIFEROL (VITAMIN D) 1000 UNITS TABLET    Take one tablet once a day for vitamin d supplement   DILTIAZEM (CARDIZEM) 120 MG TABLET    One daily to control BP and heart rhythm   NITROGLYCERIN (NITROLINGUAL) 0.4 MG/SPRAY SPRAY    At the onset of an attack, one or two sprays should be administered onto or under the tongue. No more than 3 sprays within 15 minute period.*dispense the 60 dose spray*   NYSTATIN-TRIAMCINOLONE (MYCOLOG II) CREAM    Apply 1 application topically 3 (three) times daily. To bilateral legs for skin infection until resolved   SIMVASTATIN (ZOCOR) 40 MG TABLET    TAKE ONE TABLET BY MOUTH IN THE EVENING FOR HIGH CHOLESTEROL   TURMERIC CURCUMIN PO    Take one tablet once a day  Modified  Medications   No medications on file  Discontinued Medications   No medications on file     Review of Systems  Constitutional: Negative for fever, chills, diaphoresis, activity change, appetite change and fatigue.  HENT: Negative for congestion.   Eyes: Positive for visual disturbance (corrective lenses).  Respiratory: Negative.   Cardiovascular: Negative for chest pain, palpitations and leg swelling.  Gastrointestinal: Negative for nausea, abdominal pain, diarrhea and abdominal distention.  Endocrine: Negative for cold intolerance, heat intolerance, polydipsia, polyphagia and polyuria.  Genitourinary: Negative.   Musculoskeletal: Negative.   Allergic/Immunologic: Negative.   Neurological:       Mild memory loss  Hematological: Negative.   Psychiatric/Behavioral: Negative.     Filed Vitals:   03/15/13 1338  BP: 156/76  Pulse: 76  Weight: 195 lb (88.451 kg)   Physical Exam  Constitutional: He is oriented to person, place, and time. He appears well-developed and well-nourished. No distress.  HENT:  Head: Normocephalic and atraumatic.  Right Ear: External ear normal.  Left Ear: External ear normal.  Nose: Nose normal.  Mouth/Throat: Oropharynx is clear and moist.  Eyes: Conjunctivae and EOM are normal. Pupils are equal, round, and reactive to light.  Neck: No JVD present. No tracheal deviation present. No thyromegaly present.  Cardiovascular: Normal rate, regular rhythm, normal heart sounds and intact  distal pulses.  Exam reveals no gallop and no friction rub.   No murmur heard. Pulmonary/Chest: No respiratory distress. He has no wheezes. He has no rales.  Abdominal: He exhibits no distension and no mass. There is no tenderness.  Musculoskeletal: Normal range of motion. He exhibits edema. He exhibits no tenderness.  Lymphadenopathy:    He has no cervical adenopathy.  Neurological: He is alert and oriented to person, place, and time. He has normal reflexes. No cranial nerve  deficit. Coordination normal.  Easily confused.  Skin: No rash noted. No erythema. No pallor.  AK of the right cheek and multiple other sites.  Psychiatric: He has a normal mood and affect. His behavior is normal. Judgment and thought content normal.     Labs reviewed: No visits with results within 3 Month(s) from this visit. Latest known visit with results is:  Appointment on 10/30/2012  Component Date Value Range Status  . Glucose 10/30/2012 85  65 - 99 mg/dL Final  . BUN 28/41/324409/22/2014 13  8 - 27 mg/dL Final  . Creatinine, Ser 10/30/2012 0.96  0.76 - 1.27 mg/dL Final  . GFR calc non Af Amer 10/30/2012 74  >59 mL/min/1.73 Final  . GFR calc Af Amer 10/30/2012 86  >59 mL/min/1.73 Final  . BUN/Creatinine Ratio 10/30/2012 14  10 - 22 Final  . Sodium 10/30/2012 143  134 - 144 mmol/L Final  . Potassium 10/30/2012 3.6  3.5 - 5.2 mmol/L Final  . Chloride 10/30/2012 102  97 - 108 mmol/L Final  . CO2 10/30/2012 25  18 - 29 mmol/L Final  . Calcium 10/30/2012 9.5  8.6 - 10.2 mg/dL Final  . Cholesterol, Total 10/30/2012 154  100 - 199 mg/dL Final  . Triglycerides 10/30/2012 158* 0 - 149 mg/dL Final  . HDL 01/02/725309/22/2014 30* >39 mg/dL Final   Comment: According to ATP-III Guidelines, HDL-C >59 mg/dL is considered a                          negative risk factor for CHD.  Marland Kitchen. VLDL Cholesterol Cal 10/30/2012 32  5 - 40 mg/dL Final  . LDL Calculated 10/30/2012 92  0 - 99 mg/dL Final  . Chol/HDL Ratio 10/30/2012 5.1* 0.0 - 5.0 ratio units Final  . Hemoglobin A1C 10/30/2012 5.5  4.8 - 5.6 % Final   Comment:          Increased risk for diabetes: 5.7 - 6.4                                   Diabetes: >6.4                                   Glycemic control for adults with diabetes: <7.0  . Estimated average glucose 10/30/2012 111   Final      Assessment/Plan 1. Type II or unspecified type diabetes mellitus without mention of complication, not stated as uncontrolled Continue current meds  2. Unspecified  essential hypertension Continue current medications  3. Other and unspecified hyperlipidemia stable  4. Actinic keratosis of right cheek He does not want referral at this time  5. Atrial fibrillation Seems to be in NSR at this time  6. Edema Bilateral. Not uncomfortable.

## 2013-03-15 NOTE — Patient Instructions (Signed)
Continue current medication.

## 2013-04-06 ENCOUNTER — Encounter: Payer: Self-pay | Admitting: Internal Medicine

## 2013-05-10 ENCOUNTER — Other Ambulatory Visit: Payer: Self-pay

## 2013-06-21 ENCOUNTER — Encounter: Payer: Self-pay | Admitting: Internal Medicine

## 2013-07-09 DEATH — deceased
